# Patient Record
Sex: Male | Born: 1950 | Race: White | Hispanic: No | Marital: Married
Health system: Midwestern US, Academic
[De-identification: ages and names within clinical notes are randomized; demographics above are authoritative.]

---

## 2021-01-28 ENCOUNTER — Encounter: Admit: 2021-01-28 | Discharge: 2021-01-28

## 2021-01-28 ENCOUNTER — Inpatient Hospital Stay: Admit: 2021-01-28 | Discharge: 2021-01-28

## 2021-01-28 ENCOUNTER — Inpatient Hospital Stay: Admit: 2021-01-29

## 2021-01-28 DIAGNOSIS — R69 Illness, unspecified: Secondary | ICD-10-CM

## 2021-01-28 DIAGNOSIS — N200 Calculus of kidney: Secondary | ICD-10-CM

## 2021-01-28 LAB — URINALYSIS, MICROSCOPIC

## 2021-01-28 LAB — CBC
Lab: 11 K/UL — ABNORMAL HIGH (ref 4.5–11.0)
Lab: 12 % — ABNORMAL LOW (ref 11–15)
Lab: 13 g/dL — ABNORMAL LOW (ref 13.5–16.5)
Lab: 214 K/UL — ABNORMAL LOW (ref >60)
Lab: 31 pg (ref 26–34)
Lab: 34 g/dL — ABNORMAL HIGH (ref 32.0–36.0)
Lab: 39 % — ABNORMAL LOW (ref 40–50)
Lab: 4.2 M/UL — ABNORMAL LOW (ref 4.4–5.5)
Lab: 8.5 FL (ref 7–11)
Lab: 92 FL — ABNORMAL HIGH (ref 80–100)

## 2021-01-28 LAB — URINALYSIS DIPSTICK
Lab: NEGATIVE
Lab: NEGATIVE
Lab: NEGATIVE
Lab: NEGATIVE

## 2021-01-28 LAB — BASIC METABOLIC PANEL: Lab: 139 MMOL/L (ref 137–147)

## 2021-01-28 MED ORDER — ONDANSETRON HCL (PF) 4 MG/2 ML IJ SOLN
4 mg | INTRAVENOUS | 0 refills | Status: AC | PRN
Start: 2021-01-28 — End: ?
  Administered 2021-01-29: 01:00:00 4 mg via INTRAVENOUS

## 2021-01-28 MED ORDER — PANTOPRAZOLE 40 MG PO TBEC
40 mg | Freq: Every day | ORAL | 0 refills | Status: AC
Start: 2021-01-28 — End: ?
  Administered 2021-01-29: 05:00:00 40 mg via ORAL

## 2021-01-28 MED ORDER — ONDANSETRON HCL (PF) 4 MG/2 ML IJ SOLN
4 mg | INTRAVENOUS | 0 refills | Status: DC | PRN
Start: 2021-01-28 — End: 2021-01-29

## 2021-01-28 MED ORDER — TAMSULOSIN 0.4 MG PO CAP
0.4 mg | Freq: Every day | ORAL | 0 refills | Status: AC
Start: 2021-01-28 — End: ?
  Administered 2021-01-29: 13:00:00 0.4 mg via ORAL

## 2021-01-28 MED ORDER — ONDANSETRON 4 MG PO TBDI
4 mg | ORAL | 0 refills | Status: AC | PRN
Start: 2021-01-28 — End: ?

## 2021-01-28 MED ORDER — ACETAMINOPHEN 325 MG PO TAB
650 mg | ORAL | 0 refills | Status: AC | PRN
Start: 2021-01-28 — End: ?
  Administered 2021-01-29 (×2): 650 mg via ORAL

## 2021-01-28 MED ORDER — ENOXAPARIN 40 MG/0.4 ML SC SYRG
40 mg | Freq: Every day | SUBCUTANEOUS | 0 refills | Status: DC
Start: 2021-01-28 — End: 2021-01-29

## 2021-01-28 MED ORDER — POLYETHYLENE GLYCOL 3350 17 GRAM PO PWPK
1 | Freq: Every day | ORAL | 0 refills | Status: AC | PRN
Start: 2021-01-28 — End: ?

## 2021-01-28 MED ORDER — SODIUM CHLORIDE 0.9 % IV SOLP
INTRAVENOUS | 0 refills | Status: DC
Start: 2021-01-28 — End: 2021-01-29

## 2021-01-28 MED ORDER — HEPARIN, PORCINE (PF) 5,000 UNIT/0.5 ML IJ SYRG
5000 [IU] | SUBCUTANEOUS | 0 refills | Status: AC
Start: 2021-01-28 — End: ?

## 2021-01-28 MED ORDER — MELATONIN 5 MG PO TAB
5 mg | Freq: Every evening | ORAL | 0 refills | Status: AC | PRN
Start: 2021-01-28 — End: ?

## 2021-01-28 MED ORDER — POLYETHYLENE GLYCOL 3350 17 GRAM PO PWPK
1 | Freq: Every day | ORAL | 0 refills | Status: AC
Start: 2021-01-28 — End: ?

## 2021-01-28 MED ORDER — ASPIRIN 81 MG PO TBEC
81 mg | Freq: Every day | ORAL | 0 refills | Status: AC
Start: 2021-01-28 — End: ?
  Administered 2021-01-29: 13:00:00 81 mg via ORAL

## 2021-01-28 MED ORDER — SIMVASTATIN 40 MG PO TAB
40 mg | Freq: Every evening | ORAL | 0 refills | Status: AC
Start: 2021-01-28 — End: ?
  Administered 2021-01-29: 05:00:00 40 mg via ORAL

## 2021-01-28 MED ORDER — FENTANYL CITRATE (PF) 50 MCG/ML IJ SOLN
12.5-25 ug | INTRAVENOUS | 0 refills | Status: AC | PRN
Start: 2021-01-28 — End: ?
  Administered 2021-01-29: 02:00:00 25 ug via INTRAVENOUS

## 2021-01-28 MED ORDER — ONDANSETRON 4 MG PO TBDI
4 mg | ORAL | 0 refills | Status: DC | PRN
Start: 2021-01-28 — End: 2021-01-29

## 2021-01-28 MED ORDER — SENNOSIDES-DOCUSATE SODIUM 8.6-50 MG PO TAB
1 | Freq: Two times a day (BID) | ORAL | 0 refills | Status: AC
Start: 2021-01-28 — End: ?
  Administered 2021-01-29 (×2): 1 via ORAL

## 2021-01-28 MED ORDER — SODIUM CHLORIDE 0.9 % IV SOLP
INTRAVENOUS | 0 refills | Status: AC
Start: 2021-01-28 — End: ?
  Administered 2021-01-29 (×2): 1000.000 mL via INTRAVENOUS

## 2021-01-28 MED ORDER — OXYCODONE 5 MG PO TAB
5-10 mg | ORAL | 0 refills | Status: AC | PRN
Start: 2021-01-28 — End: ?
  Administered 2021-01-29: 04:00:00 10 mg via ORAL
  Administered 2021-01-29: 21:00:00 5 mg via ORAL

## 2021-01-28 NOTE — Progress Notes
70 yr old LLQ abd pain since 0200. No relief with dilaudid. Stone has not moved. Making urine    Imaging:  CT 6 mm proximal ureteral stone causing obstruction. Perinephritic fat stranding.  Moderate hydronephrosis. Bilateral nephrolithiasis   Labs:  WBC 5.9, Hgb 14.4, Hct 41, Plat 263  Na 141, K 3.4, Cl 104, CO2 24, BUN 24, Cr 1.5 (1.2-1.4 baseline), Gluc 104  COVID-19 negative   Meds:  Dialuaid. Zofran  Vitals:  125/71  74  97.6  95%RA   Hx:  HTN. HLD. Kidney stone    Reason for Transfer:  Urology

## 2021-01-29 ENCOUNTER — Encounter: Admit: 2021-01-29 | Discharge: 2021-01-29 | Payer: MEDICARE

## 2021-01-29 ENCOUNTER — Inpatient Hospital Stay: Admit: 2021-01-29 | Discharge: 2021-01-29 | Payer: MEDICARE

## 2021-01-29 MED ORDER — PROPOFOL INJ 10 MG/ML IV VIAL
INTRAVENOUS | 0 refills | Status: DC
Start: 2021-01-29 — End: 2021-01-29
  Administered 2021-01-29: 16:00:00 150 mg via INTRAVENOUS

## 2021-01-29 MED ORDER — FENTANYL CITRATE (PF) 50 MCG/ML IJ SOLN
INTRAVENOUS | 0 refills | Status: DC
Start: 2021-01-29 — End: 2021-01-29
  Administered 2021-01-29: 17:00:00 25 ug via INTRAVENOUS
  Administered 2021-01-29: 16:00:00 75 ug via INTRAVENOUS

## 2021-01-29 MED ORDER — CEFAZOLIN 1 GRAM IJ SOLR
INTRAVENOUS | 0 refills | Status: DC
Start: 2021-01-29 — End: 2021-01-29
  Administered 2021-01-29: 17:00:00 2 g via INTRAVENOUS

## 2021-01-29 MED ORDER — ARTIFICIAL TEARS SINGLE DOSE DROPS GROUP
OPHTHALMIC | 0 refills | Status: DC
Start: 2021-01-29 — End: 2021-01-29
  Administered 2021-01-29: 17:00:00 2 [drp] via OPHTHALMIC

## 2021-01-29 MED ORDER — ONDANSETRON HCL (PF) 4 MG/2 ML IJ SOLN
INTRAVENOUS | 0 refills | Status: DC
Start: 2021-01-29 — End: 2021-01-29
  Administered 2021-01-29: 17:00:00 4 mg via INTRAVENOUS

## 2021-01-29 MED ORDER — MIDAZOLAM 1 MG/ML IJ SOLN
INTRAVENOUS | 0 refills | Status: DC
Start: 2021-01-29 — End: 2021-01-29
  Administered 2021-01-29: 16:00:00 2 mg via INTRAVENOUS

## 2021-01-29 MED ORDER — LIDOCAINE (PF) 200 MG/10 ML (2 %) IJ SYRG
INTRAVENOUS | 0 refills | Status: DC
Start: 2021-01-29 — End: 2021-01-29
  Administered 2021-01-29: 16:00:00 80 mg via INTRAVENOUS

## 2021-01-29 MED ORDER — DEXAMETHASONE SODIUM PHOSPHATE 4 MG/ML IJ SOLN
INTRAVENOUS | 0 refills | Status: DC
Start: 2021-01-29 — End: 2021-01-29
  Administered 2021-01-29: 17:00:00 4 mg via INTRAVENOUS

## 2021-01-29 MED ORDER — PHENYLEPHRINE HCL IN 0.9% NACL 1 MG/10 ML (100 MCG/ML) IV SYRG
INTRAVENOUS | 0 refills | Status: DC
Start: 2021-01-29 — End: 2021-01-29
  Administered 2021-01-29: 17:00:00 100 ug via INTRAVENOUS

## 2021-01-29 MED ORDER — SUCCINYLCHOLINE CHLORIDE 20 MG/ML IJ SOLN
INTRAVENOUS | 0 refills | Status: DC
Start: 2021-01-29 — End: 2021-01-29
  Administered 2021-01-29: 16:00:00 100 mg via INTRAVENOUS

## 2021-01-29 MED ORDER — KETOROLAC 30 MG/ML (1 ML) IJ SOLN
INTRAVENOUS | 0 refills | Status: DC
Start: 2021-01-29 — End: 2021-01-29
  Administered 2021-01-29: 17:00:00 30 mg via INTRAVENOUS

## 2021-01-29 MED ADMIN — OXYCODONE 5 MG PO TAB [10814]: 5 mg | ORAL | @ 18:00:00 | Stop: 2021-01-29 | NDC 00904696661

## 2021-01-29 MED ADMIN — IOHEXOL 300 MG IODINE/ML IV SOLN [79156]: 20 mL | INTRADUCTAL | @ 17:00:00 | Stop: 2021-01-29 | NDC 00407141361

## 2021-01-29 MED ADMIN — SODIUM CHLORIDE 0.9 % IV SOLP [27838]: 1000 mL | INTRAVENOUS | @ 16:00:00 | Stop: 2021-01-29 | NDC 00338004904

## 2021-01-29 MED FILL — TAMSULOSIN 0.4 MG PO CAP: 0.4 mg | ORAL | 30 days supply | Qty: 30 | Fill #1 | Status: CP

## 2021-01-29 MED FILL — OXYBUTYNIN CHLORIDE 5 MG PO TAB: 5 mg | ORAL | 10 days supply | Qty: 30 | Fill #1 | Status: CP

## 2021-01-29 MED FILL — OXYCODONE 5 MG PO TAB: 5 mg | ORAL | 2 days supply | Qty: 10 | Fill #1 | Status: CP

## 2021-01-29 MED FILL — NITROFURANTOIN MONOHYD/M-CRYST 100 MG PO CAP: 100 mg | ORAL | 3 days supply | Qty: 6 | Fill #1 | Status: CP

## 2021-01-29 MED FILL — HYOSCYAMINE SULFATE 0.125 MG PO TBDI: 0.125 mg | ORAL | 5 days supply | Qty: 30 | Fill #1 | Status: CP

## 2021-01-29 MED FILL — PHENAZOPYRIDINE 200 MG PO TAB: 200 mg | ORAL | 2 days supply | Qty: 6 | Fill #1 | Status: CP

## 2021-01-29 NOTE — Progress Notes
RT Adult Assessment Note    NAME:Wayne Taylor             MRN: [id number]             DOB:Aug 28, 1951          AGE: 71 y.o.  ADMISSION DATE: 01/28/2021             DAYS ADMITTED: LOS: 0 days    RT Treatment Plan:       Protocol Plan: Procedures  Oxygen/Humidity: O2 to keep SpO2 > 92%, if not on any RT modality, D/C protocol if greater than 24 hours on room air  SpO2: BID & PRN    Additional Comments:  Impressions of the patient: Patient resting comfortably on RA  Intervention(s)/outcome(s): See plan as above  Patient education that was completed: None  Recommendations to the care team: None    Vital Signs:  Pulse: 83  RR: 16 PER MINUTE  SpO2: 97 %  O2 Device: None (Room air)  Liter Flow:    O2%:    Breath Sounds: Clear (Implies normal)  Respiratory Effort: Non-Labored

## 2021-01-31 ENCOUNTER — Encounter: Admit: 2021-01-31 | Discharge: 2021-01-31 | Payer: MEDICARE

## 2021-02-02 ENCOUNTER — Encounter: Admit: 2021-02-02 | Discharge: 2021-02-02 | Payer: MEDICARE

## 2021-02-02 DIAGNOSIS — N35914 Unspecified anterior urethral stricture, male: Secondary | ICD-10-CM

## 2021-02-02 NOTE — Assessment & Plan Note
69yM with left nephrolithiasis s/p left ureteroscopy. 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

## 2021-02-02 NOTE — Assessment & Plan Note
Follow up in 63mos for symptom check.

## 2021-02-02 NOTE — Progress Notes
Date of Service: 02/02/2021     Subjective:             Wayne Taylor is a 70 y.o. male    I have reviewed this patients current past medical history, surgical history, family history, social history, allergies, and medications.    History of Present Illness    69yoM with left ureteral stone who underwent urethral dilation with URS, LL with Dr. Arletha Pili on 3/13 who presents to clinic today for catheter and stent removal (tied to the catheter).     100% Calcium oxalate monohydrate   [facility],         Review of Systems   Constitutional: Negative for activity change, appetite change, chills, diaphoresis, fatigue, fever and unexpected weight change.   HENT: Negative for congestion, hearing loss, mouth sores and sinus pressure.    Eyes: Negative for visual disturbance.   Respiratory: Negative for apnea, cough, chest tightness and shortness of breath.    Cardiovascular: Negative for chest pain, palpitations and leg swelling.   Gastrointestinal: Negative for abdominal pain, blood in stool, constipation, diarrhea, nausea, rectal pain and vomiting.   Genitourinary: Negative for decreased urine volume, difficulty urinating, dysuria, enuresis, flank pain, frequency, genital sores, hematuria, penile discharge, penile pain, penile swelling, scrotal swelling, testicular pain and urgency.   Musculoskeletal: Negative for arthralgias, back pain, gait problem and myalgias.   Skin: Negative for rash and wound.   Neurological: Negative for dizziness, tremors, seizures, syncope, weakness, light-headedness, numbness and headaches.   Hematological: Negative for adenopathy. Does not bruise/bleed easily.   Psychiatric/Behavioral: Negative for decreased concentration and dysphoric mood. The patient is not nervous/anxious.        Objective:         ? acetaminophen (TYLENOL) 325 mg tablet Take two tablets by mouth every 6 hours as needed.   ? hyoscyamine (ANASPAZ) 0.125 mg rapid dissolve tablet Dissolve one tablet by mouth every 4 hours as needed for Cramps. Max 1.5 mg/ day   ? nitrofurantoin monohyd/m-cryst (MACROBID) 100 mg capsule Take one capsule by mouth every 12 hours. Start taking the day prior to your stent removal, continue through the day after your stent removal.  Indications: genitourinary tract infections   ? oxybutynin chloride (DITROPAN) 5 mg tablet Take one tablet by mouth three times daily as needed. For Bladder-related stent discomfort   ? oxyCODONE (ROXICODONE) 5 mg tablet Take one tablet by mouth every 4 hours as needed for Pain   ? phenazopyridine (PYRIDIUM) 200 mg tablet Take one tablet by mouth three times daily as needed for Pain. Take after meals for up to 2 days.   ? senna/docusate (SENOKOT-S) 8.6/50 mg tablet Take one tablet by mouth twice daily.   ? tamsulosin (FLOMAX) 0.4 mg capsule Take one capsule by mouth daily. Take 30 min after same meal.  Do not cut/ crush/ chew. For stent-related flank/back pain.     There were no vitals filed for this visit.  There is no height or weight on file to calculate BMI.     Physical Exam  Constitutional:       Appearance: He is well-developed.   HENT:      Head: Normocephalic and atraumatic.   Eyes:      General: No scleral icterus.     Pupils: Pupils are equal, round, and reactive to light.   Cardiovascular:      Rate and Rhythm: Normal rate and regular rhythm.   Pulmonary:      Effort:  Pulmonary effort is normal. No respiratory distress.   Abdominal:      General: There is no distension.      Palpations: Abdomen is soft. There is no mass.      Tenderness: There is no abdominal tenderness.   Musculoskeletal:         General: Normal range of motion.      Cervical back: Normal range of motion and neck supple.   Skin:     General: Skin is warm and dry.      Findings: No erythema or rash.   Neurological:      Mental Status: He is alert and oriented to person, place, and time.   Psychiatric:         Thought Content: Thought content normal.            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     Nephrolithiasis  69yM with left nephrolithiasis s/p left ureteroscopy. RTC in 3mos with RBUS    stent and catheter removed today in clinic.     He was given stone prevention guidelines for increasing hydration and reducing oxalate.     Marya Landry, MD  Urologic Oncology Fellow

## 2021-02-03 ENCOUNTER — Ambulatory Visit: Admit: 2021-02-02 | Discharge: 2021-02-03 | Payer: MEDICARE

## 2021-02-03 DIAGNOSIS — N200 Calculus of kidney: Secondary | ICD-10-CM

## 2021-02-05 ENCOUNTER — Encounter: Admit: 2021-02-05 | Discharge: 2021-02-05 | Payer: MEDICARE

## 2021-02-05 NOTE — Telephone Encounter
Patient left message stating that he is having LLQ abdominal pain and cramping. He had stent removed on 02/02/21. He reports that his pain is worse in the morning, but gets better throughout the day. Patient denies fevers, chills, hematuria, urgency, and frequency. I let him know that he is most likely experiencing residual pain from getting stent removed. Advised that he pushes fluids and takes OTC pain medication PRN. He also has Levsin if he has bladder spasms. Instructed patient to call if his symptoms persist and/or worsen. No further questions and he appreciated the call back.

## 2022-08-18 DEATH — deceased
